# Patient Record
Sex: Female | Born: 1945 | Race: White | Hispanic: No | Smoking: Never smoker
Health system: Southern US, Community
[De-identification: ages and names within clinical notes are randomized; demographics above are authoritative.]

## PROBLEM LIST (undated history)

## (undated) DIAGNOSIS — S76119A Strain of unspecified quadriceps muscle, fascia and tendon, initial encounter: Secondary | ICD-10-CM

## (undated) DIAGNOSIS — M8580 Other specified disorders of bone density and structure, unspecified site: Secondary | ICD-10-CM

## (undated) DIAGNOSIS — Z79899 Other long term (current) drug therapy: Secondary | ICD-10-CM

## (undated) DIAGNOSIS — E079 Disorder of thyroid, unspecified: Secondary | ICD-10-CM

## (undated) DIAGNOSIS — N959 Unspecified menopausal and perimenopausal disorder: Secondary | ICD-10-CM

## (undated) DIAGNOSIS — T7840XA Allergy, unspecified, initial encounter: Secondary | ICD-10-CM

## (undated) DIAGNOSIS — H18519 Endothelial corneal dystrophy, unspecified eye: Secondary | ICD-10-CM

## (undated) DIAGNOSIS — H1851 Endothelial corneal dystrophy: Secondary | ICD-10-CM

## (undated) DIAGNOSIS — S62109B Fracture of unspecified carpal bone, unspecified wrist, initial encounter for open fracture: Secondary | ICD-10-CM

## (undated) DIAGNOSIS — G4700 Insomnia, unspecified: Secondary | ICD-10-CM

## (undated) DIAGNOSIS — Z8619 Personal history of other infectious and parasitic diseases: Secondary | ICD-10-CM

## (undated) DIAGNOSIS — M1990 Unspecified osteoarthritis, unspecified site: Secondary | ICD-10-CM

## (undated) DIAGNOSIS — E039 Hypothyroidism, unspecified: Secondary | ICD-10-CM

## (undated) HISTORY — PX: EYE SURGERY: SHX253

## (undated) HISTORY — PX: TUBAL LIGATION: SHX77

## (undated) HISTORY — PX: TRIGGER FINGER RELEASE: SHX641

## (undated) HISTORY — PX: TOTAL HIP ARTHROPLASTY: SHX124

## (undated) HISTORY — PX: CARPAL TUNNEL RELEASE: SHX101

## (undated) HISTORY — PX: JOINT REPLACEMENT: SHX530

## (undated) HISTORY — PX: CERVICAL FUSION: SHX112

---

## 2001-01-08 ENCOUNTER — Encounter: Payer: Self-pay | Admitting: Neurosurgery

## 2001-01-08 ENCOUNTER — Inpatient Hospital Stay (HOSPITAL_COMMUNITY): Admission: RE | Admit: 2001-01-08 | Discharge: 2001-01-10 | Payer: Self-pay | Admitting: Neurosurgery

## 2001-01-27 ENCOUNTER — Encounter: Admission: RE | Admit: 2001-01-27 | Discharge: 2001-01-27 | Payer: Self-pay | Admitting: Neurosurgery

## 2001-01-27 ENCOUNTER — Encounter: Payer: Self-pay | Admitting: Neurosurgery

## 2001-03-03 ENCOUNTER — Encounter: Payer: Self-pay | Admitting: Neurosurgery

## 2001-03-03 ENCOUNTER — Encounter: Admission: RE | Admit: 2001-03-03 | Discharge: 2001-03-03 | Payer: Self-pay | Admitting: Neurosurgery

## 2001-08-05 ENCOUNTER — Encounter: Admission: RE | Admit: 2001-08-05 | Discharge: 2001-08-05 | Payer: Self-pay | Admitting: Neurosurgery

## 2001-08-05 ENCOUNTER — Encounter: Payer: Self-pay | Admitting: Neurosurgery

## 2002-08-31 HISTORY — PX: DE QUERVAIN'S RELEASE: SHX1439

## 2005-02-11 ENCOUNTER — Ambulatory Visit: Payer: Self-pay

## 2005-08-01 ENCOUNTER — Ambulatory Visit: Payer: Self-pay | Admitting: Podiatry

## 2006-07-16 ENCOUNTER — Ambulatory Visit: Payer: Self-pay

## 2007-07-19 ENCOUNTER — Ambulatory Visit: Payer: Self-pay

## 2008-07-20 ENCOUNTER — Ambulatory Visit: Payer: Self-pay

## 2009-08-07 ENCOUNTER — Ambulatory Visit: Payer: Self-pay

## 2011-06-26 DIAGNOSIS — N959 Unspecified menopausal and perimenopausal disorder: Secondary | ICD-10-CM | POA: Insufficient documentation

## 2011-06-26 DIAGNOSIS — G4700 Insomnia, unspecified: Secondary | ICD-10-CM | POA: Insufficient documentation

## 2011-07-02 DIAGNOSIS — E039 Hypothyroidism, unspecified: Secondary | ICD-10-CM | POA: Insufficient documentation

## 2011-07-02 DIAGNOSIS — Z79899 Other long term (current) drug therapy: Secondary | ICD-10-CM | POA: Insufficient documentation

## 2012-01-18 ENCOUNTER — Ambulatory Visit: Payer: Self-pay | Admitting: Family Medicine

## 2012-02-10 ENCOUNTER — Ambulatory Visit: Payer: Self-pay | Admitting: Ophthalmology

## 2012-03-30 ENCOUNTER — Ambulatory Visit: Payer: Self-pay | Admitting: Ophthalmology

## 2012-06-29 DIAGNOSIS — M8580 Other specified disorders of bone density and structure, unspecified site: Secondary | ICD-10-CM | POA: Insufficient documentation

## 2012-06-29 DIAGNOSIS — H18519 Endothelial corneal dystrophy, unspecified eye: Secondary | ICD-10-CM | POA: Insufficient documentation

## 2012-09-22 ENCOUNTER — Ambulatory Visit: Payer: Self-pay | Admitting: Internal Medicine

## 2013-10-11 ENCOUNTER — Ambulatory Visit: Payer: Self-pay | Admitting: Internal Medicine

## 2013-11-05 ENCOUNTER — Ambulatory Visit: Payer: Self-pay | Admitting: Physician Assistant

## 2014-10-02 ENCOUNTER — Ambulatory Visit: Payer: Self-pay | Admitting: General Practice

## 2014-10-02 DIAGNOSIS — Z01810 Encounter for preprocedural cardiovascular examination: Secondary | ICD-10-CM

## 2014-10-02 LAB — URINALYSIS, COMPLETE
BILIRUBIN, UR: NEGATIVE
Bacteria: NONE SEEN
Blood: NEGATIVE
Glucose,UR: NEGATIVE mg/dL (ref 0–75)
KETONE: NEGATIVE
LEUKOCYTE ESTERASE: NEGATIVE
Nitrite: NEGATIVE
Ph: 8 (ref 4.5–8.0)
Protein: NEGATIVE
Specific Gravity: 1.005 (ref 1.003–1.030)
Squamous Epithelial: NONE SEEN
WBC UR: <1 /HPF (ref 0–5)

## 2014-10-02 LAB — MRSA PCR SCREENING

## 2014-10-02 LAB — BASIC METABOLIC PANEL
Anion Gap: 9 (ref 7–16)
BUN: 15 mg/dL (ref 7–18)
Calcium, Total: 9.2 mg/dL (ref 8.5–10.1)
Chloride: 107 mmol/L (ref 98–107)
Co2: 26 mmol/L (ref 21–32)
Creatinine: 0.89 mg/dL (ref 0.60–1.30)
EGFR (African American): >60
EGFR (Non-African Amer.): >60
Glucose: 80 mg/dL (ref 65–99)
Osmolality: 283 (ref 275–301)
Potassium: 3.9 mmol/L (ref 3.5–5.1)
Sodium: 142 mmol/L (ref 136–145)

## 2014-10-02 LAB — CBC
HCT: 39.5 % (ref 35.0–47.0)
HGB: 13 g/dL (ref 12.0–16.0)
MCH: 31.1 pg (ref 26.0–34.0)
MCHC: 32.9 g/dL (ref 32.0–36.0)
MCV: 95 fL (ref 80–100)
Platelet: 412 10*3/uL (ref 150–440)
RBC: 4.17 10*6/uL (ref 3.80–5.20)
RDW: 14.7 % — ABNORMAL HIGH (ref 11.5–14.5)
WBC: 8 10*3/uL (ref 3.6–11.0)

## 2014-10-02 LAB — APTT: Activated PTT: 27.9 secs (ref 23.6–35.9)

## 2014-10-02 LAB — PROTIME-INR
INR: 1
Prothrombin Time: 12.6 secs (ref 11.5–14.7)

## 2014-10-02 LAB — SEDIMENTATION RATE: Erythrocyte Sed Rate: 24 mm/hr (ref 0–30)

## 2014-10-03 LAB — URINE CULTURE

## 2014-10-11 ENCOUNTER — Inpatient Hospital Stay: Payer: Self-pay | Admitting: General Practice

## 2014-10-12 LAB — BASIC METABOLIC PANEL
Anion Gap: 9 (ref 7–16)
BUN: 11 mg/dL (ref 7–18)
CALCIUM: 7.7 mg/dL — AB (ref 8.5–10.1)
Chloride: 104 mmol/L (ref 98–107)
Co2: 23 mmol/L (ref 21–32)
Creatinine: 0.74 mg/dL (ref 0.60–1.30)
EGFR (Non-African Amer.): >60
Glucose: 118 mg/dL — ABNORMAL HIGH (ref 65–99)
Osmolality: 272 (ref 275–301)
POTASSIUM: 3.9 mmol/L (ref 3.5–5.1)
Sodium: 136 mmol/L (ref 136–145)

## 2014-10-12 LAB — PLATELET COUNT: PLATELETS: 321 10*3/uL (ref 150–440)

## 2014-10-12 LAB — HEMOGLOBIN: HGB: 11 g/dL — ABNORMAL LOW (ref 12.0–16.0)

## 2014-10-13 LAB — BASIC METABOLIC PANEL
ANION GAP: 7 (ref 7–16)
BUN: 11 mg/dL (ref 7–18)
CALCIUM: 8.7 mg/dL (ref 8.5–10.1)
Chloride: 105 mmol/L (ref 98–107)
Co2: 24 mmol/L (ref 21–32)
Creatinine: 0.78 mg/dL (ref 0.60–1.30)
EGFR (African American): >60
GLUCOSE: 126 mg/dL — AB (ref 65–99)
Osmolality: 273 (ref 275–301)
Potassium: 3.3 mmol/L — ABNORMAL LOW (ref 3.5–5.1)
Sodium: 136 mmol/L (ref 136–145)

## 2014-10-13 LAB — HEMOGLOBIN: HGB: 11.4 g/dL — AB (ref 12.0–16.0)

## 2014-10-13 LAB — PLATELET COUNT: Platelet: 362 10*3/uL (ref 150–440)

## 2014-11-15 ENCOUNTER — Ambulatory Visit: Payer: Self-pay | Admitting: Internal Medicine

## 2015-02-01 DIAGNOSIS — S76119A Strain of unspecified quadriceps muscle, fascia and tendon, initial encounter: Secondary | ICD-10-CM | POA: Insufficient documentation

## 2015-03-24 NOTE — Discharge Summary (Signed)
PATIENT NAME:  Nicole Jacobson, Nicole Jacobson MR#:  [id number] DATE OF BIRTH:  June 16, 1946  DATE OF ADMISSION:  10/11/2014 DATE OF DISCHARGE:  10/14/2014  ADMITTING DIAGNOSIS: Degenerative arthrosis of the left hip.   DISCHARGE DIAGNOSIS: Degenerative arthrosis of the left hip.   HISTORY: The patient is a 69 year old female who has been following at [facility] for progression of left hip and groin pain. She reported a long history of progressive left hip and groin pain. Her pain was noted to be aggravated with weight-bearing activities. She had also appreciated some decrease in her left hip range of motion. On occasion, the patient was using a cane for ambulation due to the severity of pain. She had not seen any significant improvement in her condition despite the use of meloxicam. The pain had progressed to the point that it was significantly interfering with her activities of daily living. X-rays taken in [facility] orthopedics department showed significant narrowing of the cartilage space. There was subchondral sclerosis as well as osteophyte formation noted. After discussion of the risks and benefits of surgical intervention, the patient expressed her understanding of the risks and benefits and agreed for plans for surgical intervention.   HOSPITAL COURSE PROCEDURE: Left total hip arthroplasty.   ANESTHESIA: Spinal.   IMPLANTS UTILIZED: DePuy 13.5 mm small stature AML femoral stem, a 48 mm outer diameter Pinnacle 100 acetabular component, +4 mm neutral Pinnacle Marathon polyethylene liner, and a 32 mm cobalt chrome hip ball with a +1 mm neck length.   HOSPITAL COURSE: The patient tolerated the procedure very well. She had no complications. She was then taken to the PACU where she was stabilized and then transferred to the orthopedic floor. The patient began receiving anticoagulation therapy of Lovenox 30 mg subcutaneous every 12 hours per anesthesia and pharmacy protocol. She was fitted with TED  stockings bilaterally. These were allowed to be removed 1 hour per 8 hour shift. She was also fitted with AVI compression foot pumps bilaterally set at 80 mmHg. Her calves have been nontender. There has been no evidence of any DVTs. Heels were elevated off the bed using rolled towels.   The patient has denied any chest pain or shortness of breath. Her vital signs have been stable. She has been afebrile. Hemodynamically she was stable. No transfusions were given.   Physical therapy was initiated on day 1 for gait training and transfers. She has done extremely well. She was ambulating greater than 200 feet upon discharge. She was able to go up and down 4 sets of steps. She was independent with bed to chair transfers. Occupational therapy was also initiated on day 1 for ADLs and assistive devices.   The patient's IV, Foley, and Hemovac were discontinued on day 2, along with a dressing change. The wound has been drying for any signs of infection.   DISPOSITION: The patient is being discharged to home in improved stable condition.   DISCHARGE INSTRUCTIONS: She may continue to weight bear as tolerated. Continue using a walker until cleared by physical therapy to go to a quad cane. She will receive home health PT. Continue with the TED stockings. These are to be worn during the day but may be removed at night. She is gone over the hip precautions once again. Elevate the heels off the bed. Recommend that she continue with incentive spirometer q. 1 hour while awake and encouraged the patient to do cough and deep breathing q. 2 hours while awake. She is placed on a regular  diet.   She was instructed on wound care. Physical therapist will remove the staples in 2 weeks on 11/25. They are to apply benzoin and Steri-Strips. Call the clinic if any signs of infection. If she has any fevers of 101.5, she is to call the clinic. She has a followup appointment on Novemeber 24 at 9:15.   The patient is discharged to home  on Lovenox 40 mg subcutaneous daily for 14 days, then discontinue and begin taking one 81 mg enteric-coated aspirin, oxycodone 5 to 10 mg every 4 to 6 hours p.r.n. for pain, Ultram 50 to 100 mg every 4 to 6 hours p.r.n. for pain. She may resume her regular medication that she was on prior to admission.   PAST MEDICAL HISTORY: Thyroid disease, depression.    ____________________________ Van ClinesJon Wolfe, PA jrw:at D: 10/14/2014 07:27:30 ET T: 10/14/2014 12:41:47 ET JOB#: [id number]  cc: Van ClinesJon Wolfe, PA, <Dictator> JON WOLFE PA ELECTRONICALLY SIGNED 10/24/2014 21:05

## 2015-03-24 NOTE — Op Note (Signed)
PATIENT NAME:  Nicole Jacobson, Nicole Jacobson MR#:  [id number] DATE OF BIRTH:  02/10/1946  DATE OF PROCEDURE:  10/11/2014  PREOPERATIVE DIAGNOSIS: Degenerative arthrosis of the left hip.   POSTOPERATIVE DIAGNOSIS: Degenerative arthrosis of the left hip.   PROCEDURE PERFORMED: Left total hip arthroplasty.   SURGEON: Francesco Sor, MD.   ASSISTANT:  Van Clines, PA (required to maintain retraction throughout the procedure).   ANESTHESIA: Spinal.   ESTIMATED BLOOD LOSS: 100 mL.   FLUIDS REPLACED: 2100 mL of crystalloid.   DRAINS: Two medium drains to Hemovac reservoir.   IMPLANTS UTILIZED: DePuy 13.5-mm small stature AML femoral stem, a 48-mm outer diameter Pinnacle 100 acetabular component, +4-mm neutral Pinnacle Marathon polyethylene liner, and a 32-mm cobalt chrome hip ball with a +1-mm neck length.   INDICATIONS FOR SURGERY: The patient is a 69 year old female who has been seen for complaints of progressive left hip and groin pain. Radiographs demonstrated significant degenerative changes. After discussion of the risks and benefits of surgical intervention, the patient expressed understanding of the risks and benefits, and agreed with plans for surgical intervention.   PROCEDURE IN DETAIL: The patient was brought to the operating room and after adequate spinal anesthesia was achieved the patient was placed in a right lateral decubitus position. Axillary roll was placed and all bony prominences were well padded. The patient's left hip and leg were cleaned and prepped with alcohol and DuraPrep and draped in the usual sterile fashion. A "timeout" was performed as per usual protocol. A lateral curvilinear incision was made gently curving towards the posterior superior iliac spine. IT band was incised in line with the skin incision and the fibers of the gluteus maximus were split in line. The piriformis tendon was identified, skeletonized and incised at its insertion and the proximal femur and reflected  posteriorly. In a similar fashion the short external rotators were incised and reflected posteriorly. A T-type posterior capsulotomy was performed. Prior to dislocation of the femoral head a threaded Steinmann pin was inserted through a separate stab incision into the pelvis superior to the acetabulum and bent in the form of a stylus so as to assess limb length and hip offset throughout the procedure.   The femoral head was then dislocated posteriorly. Inspection of the head demonstrated severe degenerative changes with full-thickness loss of articular cartilage. The femoral neck cut was performed using an oscillating saw. The capsule was elevated off of the anterior portion of the femoral neck. Inspection of the acetabulum also demonstrated degenerative changes. The remnant of the labrum was excised. The acetabulum was reamed in a sequential fashion up to a 47-mm diameter. Good punctate bleeding bone was encountered. A 48-mm Pinnacle 100 acetabular component was positioned and impacted into place. Excellent scratch fit was appreciated. Anterior osteophyte was debrided using osteotome and rongeur. A +4 mm neutral polyethylene trial was inserted and attention was directed to the proximal femur.   A pilot hole for reaming of the proximal femoral canal was created using a high-speed bur. The proximal femoral canal was reamed in a sequential fashion up to a 13-mm diameter. This allowed for approximately 6 cm of scratch fit. The proximal femur was prepared using a 13.5-mm aggressive side-biting reamer. Serial broaches were inserted up to a 13.5-mm small stature broach. The calcar region was planed and trial reduction was performed with a 32-mm hip ball with a +1-mm neck length. This allowed for good equalization of limb lengths and appropriate hip offset. Excellent stability was noted both anteriorly and posteriorly.  Trial components were removed. The acetabular shell was irrigated with normal saline with  antibiotic solution and suctioned dry. A +40-mm neutral Pinnacle Marathon polyethylene liner was positioned and impacted into place. Next, a 13.5-mm small stature AML femoral component was positioned and impacted into place. Excellent scratch fit was appreciated. The Morse taper was cleaned and dried. A 32-mm cobalt chrome hip ball with a +1-mm neck length was placed on the trunnion and impacted into place. The hip was reduced and placed through a range of motion. Excellent stability was appreciated both anteriorly and posteriorly. Good equalization of limb lengths was appreciated and appropriate hip offset was noted.   The wound was irrigated with copious amounts of normal saline with antibiotic solution and then suctioned dry. Good hemostasis was appreciated. The posterior capsulotomy was repaired using #5 Ethibond. The piriformis tendon was reapproximated on the surface of the gluteus medius tendon using #5 Ethibond. Two medium drains were placed in the wound bed and brought out through a separate stab incision to be attached to a Hemovac reservoir. IT band was repaired using interrupted sutures of #1 Vicryl. The subcutaneous tissue was approximated in layers using first 0-Vicryl followed by 2-0 Vicryl. Skin was closed with skin staples. A sterile dressing was applied. The patient tolerated the procedure well. She was transported to the recovery room in stable condition.    ____________________________ Illene LabradorJames P. Angie FavaHooten Jr., MD jph:lt D: 10/12/2014 06:27:40 ET T: 10/12/2014 07:58:22 ET JOB#: [id number]  cc: Illene LabradorJames P. Angie FavaHooten Jr., MD, <Dictator> JAMES P Angie FavaHOOTEN JR MD ELECTRONICALLY SIGNED 10/19/2014 21:40

## 2015-03-25 NOTE — Op Note (Signed)
PATIENT NAME:  Nicole Jacobson, Nicole Jacobson MR#:  [id number] DATE OF BIRTH:  March 21, 1946  DATE OF PROCEDURE:  03/30/2012  PREOPERATIVE DIAGNOSIS: Visually significant cataract of the right eye.   POSTOPERATIVE DIAGNOSIS: Visually significant cataract of the right eye.   OPERATIVE PROCEDURE: Cataract extraction by phacoemulsification with implant of intraocular lens to right eye.   SURGEON: Galen ManilaWilliam Meriel Kelliher, MD.   ANESTHESIA:  1. Managed anesthesia care.  2. Topical tetracaine drops followed by 2% Xylocaine jelly applied in the preoperative holding area.   COMPLICATIONS: None.   TECHNIQUE:  Four quadrant divide and conquer.   DESCRIPTION OF PROCEDURE: The patient was examined and consented in the preoperative holding area where the aforementioned topical anesthesia was applied to the right eye and then brought back to the Operating Room where the right eye was prepped and draped in the usual sterile ophthalmic fashion and a lid speculum was placed. A paracentesis was created with the side port blade and the anterior chamber was filled with viscoelastic. A near clear corneal incision was performed with the steel keratome. A continuous curvilinear capsulorrhexis was performed with a cystotome followed by the capsulorrhexis forceps. Hydrodissection and hydrodelineation were carried out with BSS on a blunt cannula. The lens was removed in a four quadrant divide and conquer technique and the remaining cortical material was removed with the irrigation-aspiration handpiece. The capsular bag was inflated with viscoelastic and the Tecnis ZCB00 22.0-diopter lens, serial number [id number] was placed in the capsular bag without complication. The remaining viscoelastic was removed from the eye with the irrigation-aspiration handpiece. The wounds were hydrated. The anterior chamber was flushed with Miostat and the eye was inflated to physiologic pressure. The wounds were found to be water tight. The eye was dressed with  Vigamox. The patient was given protective glasses to wear throughout the day and a shield with which to sleep tonight. The patient was also given drops with which to begin a drop regimen today and will follow-up with me in one day.   ____________________________ Jerilee FieldWilliam L. Nollan Muldrow, MD wlp:slb D: 03/30/2012 12:22:45 ET Jacobson: 03/30/2012 12:44:23 ET JOB#: [id number]  cc: Taleen Prosser L. Khalel Alms, MD, <Dictator> Jerilee FieldWILLIAM L Naryah Clenney MD ELECTRONICALLY SIGNED 04/06/2012 13:34

## 2015-03-25 NOTE — Op Note (Signed)
PATIENT NAME:  Nicole Jacobson, Nicole Jacobson DATE OF BIRTH:  01-13-1946  DATE OF PROCEDURE:  02/10/2012  PREOPERATIVE DIAGNOSIS: Visually significant cataract of the left eye.   POSTOPERATIVE DIAGNOSIS: Visually significant cataract of the left eye.   OPERATIVE PROCEDURE: Cataract extraction by phacoemulsification with implant of intraocular lens to left eye.   SURGEON: Galen ManilaWilliam Creedon Danielski, MD.   ANESTHESIA:  1. Managed anesthesia care.  2. Topical tetracaine drops followed by 2% Xylocaine jelly applied in the preoperative holding area.   COMPLICATIONS: None.   TECHNIQUE:  Stop-and-chop    DESCRIPTION OF PROCEDURE: The patient was examined and consented in the preoperative holding area where the aforementioned topical anesthesia was applied to the left eye and then brought back to the Operating Room where the left eye was prepped and draped in the usual sterile ophthalmic fashion and a lid speculum was placed. A paracentesis was created with the side port blade and the anterior chamber was filled with viscoelastic. A near clear corneal incision was performed with the steel keratome. A continuous curvilinear capsulorrhexis was performed with a cystotome followed by the capsulorrhexis forceps. Hydrodissection and hydrodelineation were carried out with BSS on a blunt cannula. The lens was removed in a stop-and-chop technique and the remaining cortical material was removed with the irrigation-aspiration handpiece. The capsular bag was inflated with viscoelastic and the Technus ZCB00 22.5-diopter lens, serial number [id number] was placed in the capsular bag without complication. The remaining viscoelastic was removed from the eye with the irrigation-aspiration handpiece. The wounds were hydrated. The anterior chamber was flushed with Miostat and the eye was inflated to physiologic pressure. The wounds were found to be water tight. The eye was dressed with Vigamox. The patient was given protective  glasses to wear throughout the day and a shield with which to sleep tonight. The patient was also given drops with which to begin a drop regimen today and will follow-up with me in one day.   ____________________________ Jerilee FieldWilliam L. Sadee Osland, MD wlp:drc D: 02/10/2012 13:10:02 ET Jacobson: 02/10/2012 13:21:50 ET JOB#: [id number]  cc: Frederico Gerling L. Zerrick Hanssen, MD, <Dictator> Jerilee FieldWILLIAM L Marjarie Irion MD ELECTRONICALLY SIGNED 02/11/2012 13:42

## 2015-03-26 LAB — SURGICAL PATHOLOGY

## 2015-10-08 DIAGNOSIS — Z96643 Presence of artificial hip joint, bilateral: Secondary | ICD-10-CM | POA: Insufficient documentation

## 2015-12-05 ENCOUNTER — Encounter: Payer: Self-pay | Admitting: *Deleted

## 2015-12-06 ENCOUNTER — Ambulatory Visit: Payer: Medicare Other | Admitting: Anesthesiology

## 2015-12-06 ENCOUNTER — Encounter: Payer: Self-pay | Admitting: *Deleted

## 2015-12-06 ENCOUNTER — Ambulatory Visit
Admission: RE | Admit: 2015-12-06 | Discharge: 2015-12-06 | Disposition: A | Discharge status: Home / Self Care | Payer: Medicare Other | Source: Ambulatory Visit | Attending: Unknown Physician Specialty | Admitting: Unknown Physician Specialty

## 2015-12-06 ENCOUNTER — Encounter
Admission: RE | Disposition: A | Discharge status: Home / Self Care | Payer: Self-pay | Source: Ambulatory Visit | Attending: Unknown Physician Specialty

## 2015-12-06 DIAGNOSIS — Z86010 Personal history of colonic polyps: Secondary | ICD-10-CM | POA: Insufficient documentation

## 2015-12-06 DIAGNOSIS — Z9889 Other specified postprocedural states: Secondary | ICD-10-CM | POA: Insufficient documentation

## 2015-12-06 DIAGNOSIS — K5730 Diverticulosis of large intestine without perforation or abscess without bleeding: Secondary | ICD-10-CM | POA: Insufficient documentation

## 2015-12-06 DIAGNOSIS — M8580 Other specified disorders of bone density and structure, unspecified site: Secondary | ICD-10-CM | POA: Diagnosis not present

## 2015-12-06 DIAGNOSIS — M1990 Unspecified osteoarthritis, unspecified site: Secondary | ICD-10-CM | POA: Diagnosis not present

## 2015-12-06 DIAGNOSIS — K640 First degree hemorrhoids: Secondary | ICD-10-CM | POA: Insufficient documentation

## 2015-12-06 DIAGNOSIS — E039 Hypothyroidism, unspecified: Secondary | ICD-10-CM | POA: Insufficient documentation

## 2015-12-06 DIAGNOSIS — Z9109 Other allergy status, other than to drugs and biological substances: Secondary | ICD-10-CM | POA: Insufficient documentation

## 2015-12-06 DIAGNOSIS — Z9842 Cataract extraction status, left eye: Secondary | ICD-10-CM | POA: Diagnosis not present

## 2015-12-06 DIAGNOSIS — Z96643 Presence of artificial hip joint, bilateral: Secondary | ICD-10-CM | POA: Insufficient documentation

## 2015-12-06 DIAGNOSIS — Z981 Arthrodesis status: Secondary | ICD-10-CM | POA: Insufficient documentation

## 2015-12-06 DIAGNOSIS — Z7951 Long term (current) use of inhaled steroids: Secondary | ICD-10-CM | POA: Diagnosis not present

## 2015-12-06 DIAGNOSIS — Z7982 Long term (current) use of aspirin: Secondary | ICD-10-CM | POA: Insufficient documentation

## 2015-12-06 DIAGNOSIS — Z9841 Cataract extraction status, right eye: Secondary | ICD-10-CM | POA: Diagnosis not present

## 2015-12-06 DIAGNOSIS — Z79899 Other long term (current) drug therapy: Secondary | ICD-10-CM | POA: Insufficient documentation

## 2015-12-06 HISTORY — DX: Fracture of unspecified carpal bone, unspecified wrist, initial encounter for open fracture: S62.109B

## 2015-12-06 HISTORY — DX: Hypothyroidism, unspecified: E03.9

## 2015-12-06 HISTORY — DX: Allergy, unspecified, initial encounter: T78.40XA

## 2015-12-06 HISTORY — PX: COLONOSCOPY WITH PROPOFOL: SHX5780

## 2015-12-06 HISTORY — DX: Unspecified menopausal and perimenopausal disorder: N95.9

## 2015-12-06 HISTORY — DX: Insomnia, unspecified: G47.00

## 2015-12-06 HISTORY — DX: Disorder of thyroid, unspecified: E07.9

## 2015-12-06 HISTORY — DX: Unspecified osteoarthritis, unspecified site: M19.90

## 2015-12-06 HISTORY — DX: Personal history of other infectious and parasitic diseases: Z86.19

## 2015-12-06 HISTORY — DX: Strain of unspecified quadriceps muscle, fascia and tendon, initial encounter: S76.119A

## 2015-12-06 HISTORY — DX: Endothelial corneal dystrophy, unspecified eye: H18.519

## 2015-12-06 HISTORY — DX: Other long term (current) drug therapy: Z79.899

## 2015-12-06 HISTORY — DX: Other specified disorders of bone density and structure, unspecified site: M85.80

## 2015-12-06 HISTORY — DX: Endothelial corneal dystrophy: H18.51

## 2015-12-06 SURGERY — COLONOSCOPY WITH PROPOFOL
Anesthesia: General

## 2015-12-06 MED ORDER — PROPOFOL 500 MG/50ML IV EMUL
INTRAVENOUS | Status: DC | PRN
  Administered 2015-12-06: 120 ug/kg/min via INTRAVENOUS

## 2015-12-06 MED ORDER — SODIUM CHLORIDE 0.9 % IV SOLN
INTRAVENOUS | Status: DC
  Administered 2015-12-06: 1000 mL via INTRAVENOUS

## 2015-12-06 MED ORDER — MIDAZOLAM HCL 2 MG/2ML IJ SOLN
INTRAMUSCULAR | Status: DC | PRN
  Administered 2015-12-06: 1 mg via INTRAVENOUS

## 2015-12-06 MED ORDER — FENTANYL CITRATE (PF) 100 MCG/2ML IJ SOLN
INTRAMUSCULAR | Status: DC | PRN
Start: 2015-12-06 — End: 2015-12-06
  Administered 2015-12-06: 50 ug via INTRAVENOUS

## 2015-12-06 MED ORDER — PIPERACILLIN-TAZOBACTAM 3.375 G IVPB 30 MIN
3.3750 g | Freq: Once | INTRAVENOUS | Status: AC
  Administered 2015-12-06: 3.375 g via INTRAVENOUS
  Filled 2015-12-06: qty 50

## 2015-12-06 MED ORDER — EPHEDRINE SULFATE 50 MG/ML IJ SOLN
INTRAMUSCULAR | Status: DC | PRN
  Administered 2015-12-06: 10 mg via INTRAVENOUS
  Administered 2015-12-06: 5 mg via INTRAVENOUS

## 2015-12-06 NOTE — Op Note (Signed)
[facility] Gastroenterology Patient Name: Nicole Jacobson Procedure Date: 12/06/2015 9:14 AM MRN: [id number] Account #: [id number] Date of Birth: 08-Mar-1946 Admit Type: Outpatient Age: 70 Room: [facility] ENDO ROOM 1 Gender: Female Note Status: Finalized Procedure:         Colonoscopy Indications:       High risk colon cancer surveillance: Personal history of                     colonic polyps Providers:         Scot Jun, MD Referring MD:      Cecille Aver (Referring MD) Medicines:         Propofol per Anesthesia Complications:     No immediate complications. Procedure:         Pre-Anesthesia Assessment:                    - After reviewing the risks and benefits, the patient was                     deemed in satisfactory condition to undergo the procedure.                    - After reviewing the risks and benefits, the patient was                     deemed in satisfactory condition to undergo the procedure.                    After obtaining informed consent, the colonoscope was                     passed under direct vision. Throughout the procedure, the                     patient's blood pressure, pulse, and oxygen saturations                     were monitored continuously. The Colonoscope was                     introduced through the anus and advanced to the the cecum,                     identified by appendiceal orifice and ileocecal valve. The                     colonoscopy was performed without difficulty. The patient                     tolerated the procedure well. The quality of the bowel                     preparation was good. Findings:      Many small-mouthed diverticula were found in the sigmoid colon, in the       descending colon and in the ascending colon.      Internal hemorrhoids were found during endoscopy. The hemorrhoids were       small -medium and Grade I (internal hemorrhoids that do not prolapse).      The exam was  otherwise without abnormality. Impression:        - Diverticulosis in the sigmoid colon, in the descending  colon and in the ascending colon.                    - Internal hemorrhoids.                    - The examination was otherwise normal.                    - No specimens collected. Recommendation:    - Repeat colonoscopy in 5 years for surveillance. Scot Junobert T Elliott, MD 12/06/2015 9:49:42 AM This report has been signed electronically. Number of Addenda: 0 Note Initiated On: 12/06/2015 9:14 AM Scope Withdrawal Time: 0 hours 6 minutes 21 seconds  Total Procedure Duration: 0 hours 14 minutes 39 seconds       [facility]

## 2015-12-06 NOTE — Transfer of Care (Signed)
Immediate Anesthesia Transfer of Care Note  Patient: Nicole Jacobson  Procedure(s) Performed: Procedure(s): COLONOSCOPY WITH PROPOFOL (N/A)  Patient Location: PACU  Anesthesia Type:General  Level of Consciousness: awake, alert , oriented and sedated  Airway & Oxygen Therapy: Patient Spontanous Breathing and Patient connected to nasal cannula oxygen  Post-op Assessment: Report given to RN and Post -op Vital signs reviewed and stable  Post vital signs: Reviewed and stable  Last Vitals:  Filed Vitals:   12/06/15 0831  BP: 140/79  Pulse: 64  Temp: 37.6 C  Resp: 16    Complications: No apparent anesthesia complications

## 2015-12-06 NOTE — Anesthesia Preprocedure Evaluation (Signed)
Anesthesia Evaluation  Patient identified by MRN, date of birth, ID band Patient awake    Reviewed: Allergy & Precautions, H&P , NPO status , Patient's Chart, lab work & pertinent test results, reviewed documented beta blocker date and time   History of Anesthesia Complications (+) PONV and history of anesthetic complications  Airway Mallampati: I  TM Distance: >3 FB Neck ROM: full    Dental no notable dental hx. (+) Caps, Teeth Intact   Pulmonary neg pulmonary ROS,    Pulmonary exam normal breath sounds clear to auscultation       Cardiovascular Exercise Tolerance: Good negative cardio ROS Normal cardiovascular exam Rhythm:regular Rate:Normal     Neuro/Psych negative neurological ROS  negative psych ROS   GI/Hepatic negative GI ROS, Neg liver ROS,   Endo/Other  neg diabetesHypothyroidism   Renal/GU negative Renal ROS  negative genitourinary   Musculoskeletal   Abdominal   Peds  Hematology negative hematology ROS (+)   Anesthesia Other Findings Past Medical History:   Insomnia, unspecified                                        Menopausal and postmenopausal disorder                         Comment:unspecified   Hypothyroidism                                                 Comment:unspecified   Encounter for long-term (current) use of other*              Fuch endothelial dystrophy                                   Osteopenia                                                   Quadriceps muscle strain                                     Allergic state                                               Arthritis                                                    Fuchs' corneal dystrophy                                     History of chickenpox  Osteopenia                                                     Comment:hips   Thyroid disease                                               Wrist fracture, open                                           Comment:left wrist   Reproductive/Obstetrics negative OB ROS                             Anesthesia Physical Anesthesia Plan  ASA: II  Anesthesia Plan: General   Post-op Pain Management:    Induction:   Airway Management Planned:   Additional Equipment:   Intra-op Plan:   Post-operative Plan:   Informed Consent: I have reviewed the patients History and Physical, chart, labs and discussed the procedure including the risks, benefits and alternatives for the proposed anesthesia with the patient or authorized representative who has indicated his/her understanding and acceptance.   Dental Advisory Given  Plan Discussed with: Anesthesiologist, CRNA and Surgeon  Anesthesia Plan Comments:         Anesthesia Quick Evaluation

## 2015-12-06 NOTE — Anesthesia Procedure Notes (Signed)
Performed by: COOK-MARTIN, Ahijah Devery Pre-anesthesia Checklist: Patient identified, Emergency Drugs available, Suction available, Patient being monitored and Timeout performed Patient Re-evaluated:Patient Re-evaluated prior to inductionOxygen Delivery Method: Nasal cannula Preoxygenation: Pre-oxygenation with 100% oxygen Intubation Type: IV induction Placement Confirmation: positive ETCO2 and CO2 detector       

## 2015-12-06 NOTE — H&P (Signed)
Primary Care Physician:  Margorie JohnAMSEY,COLLEEN, MD Primary Gastroenterologist:  Dr. Mechele CollinElliott  Pre-Procedure History & Physical: HPI:  Nicole Jacobson is a 70 y.o. female is here for an colonoscopy.   Past Medical History  Diagnosis Date  . Insomnia, unspecified   . Menopausal and postmenopausal disorder     unspecified  . Hypothyroidism     unspecified  . Encounter for long-term (current) use of other high-risk medications   . Fuch endothelial dystrophy   . Osteopenia   . Quadriceps muscle strain   . Allergic state   . Arthritis   . Fuchs' corneal dystrophy   . History of chickenpox   . Osteopenia     hips  . Thyroid disease   . Wrist fracture, open     left wrist    Past Surgical History  Procedure Laterality Date  . Joint replacement      bilateral hips; 10/2014 most recent  . Eye surgery Bilateral     cataract extraction with lens implants  . Tubal ligation    . Carpal tunnel release Right     endoscopic  . Cervical fusion      two level  . De quervain's release Left 08/2002  . Trigger finger release    . Total hip arthroplasty Left     10/11/14  . Total hip arthroplasty Right     07/02/2004    Prior to Admission medications   Medication Sig Start Date End Date Taking? Authorizing Provider  aspirin EC 81 MG tablet Take 81 mg by mouth daily.   Yes Historical Provider, MD  BIOTIN PO Take by mouth.   Yes Historical Provider, MD  Calcium-Vitamin D (CALTRATE 600 PLUS-VIT D PO) Take 1 tablet by mouth 3 (three) times daily.   Yes Historical Provider, MD  fluticasone (FLONASE) 50 MCG/ACT nasal spray Place 1 spray into both nostrils 2 (two) times daily.   Yes Historical Provider, MD  levothyroxine (SYNTHROID, LEVOTHROID) 50 MCG tablet Take 50 mcg by mouth daily before breakfast.   Yes Historical Provider, MD  Multiple Vitamins-Minerals (MULTIVITAMIN & MINERAL PO) Take by mouth.   Yes Historical Provider, MD  Omega-3 Fatty Acids (FISH OIL PO) Take by mouth.   Yes  Historical Provider, MD  zolpidem (AMBIEN) 5 MG tablet Take 10 mg by mouth at bedtime as needed for sleep.   Yes Historical Provider, MD    Allergies as of 11/02/2015  . (Not on File)    History reviewed. No pertinent family history.  Social History   Social History  . Marital Status: Divorced    Spouse Name: N/A  . Number of Children: N/A  . Years of Education: N/A   Occupational History  . Not on file.   Social History Main Topics  . Smoking status: Never Smoker   . Smokeless tobacco: Not on file  . Alcohol Use: 1.8 oz/week    3 Glasses of wine per week  . Drug Use: No  . Sexual Activity: Not on file   Other Topics Concern  . Not on file   Social History Narrative    Review of Systems: See HPI, otherwise negative ROS  Physical Exam: BP 140/79 mmHg  Pulse 64  Temp(Src) 99.7 F (37.6 C) (Tympanic)  Resp 16  Ht 5\' 3"  (1.6 m)  Wt 64.864 kg (143 lb)  BMI 25.34 kg/m2  SpO2 100% General:   Alert,  pleasant and cooperative in NAD Head:  Normocephalic and atraumatic. Neck:  Supple; no  masses or thyromegaly. Lungs:  Clear throughout to auscultation.    Heart:  Regular rate and rhythm. Abdomen:  Soft, nontender and nondistended. Normal bowel sounds, without guarding, and without rebound.   Neurologic:  Alert and  oriented x4;  grossly normal neurologically.  Impression/Plan: Nicole Jacobson is here for an colonoscopy to be performed for Ph colon polyps, Zosyn given for artificial joints bilat hip replacement  Risks, benefits, limitations, and alternatives regarding  colonoscopy have been reviewed with the patient.  Questions have been answered.  All parties agreeable.   Lynnae Prude, MD  12/06/2015, 9:19 AM

## 2015-12-06 NOTE — Anesthesia Postprocedure Evaluation (Signed)
Anesthesia Post Note  Patient: Nicole Jacobson  Procedure(s) Performed: Procedure(s) (LRB): COLONOSCOPY WITH PROPOFOL (N/A)  Patient location during evaluation: Endoscopy Anesthesia Type: General Level of consciousness: awake and alert Pain management: pain level controlled Vital Signs Assessment: post-procedure vital signs reviewed and stable Respiratory status: spontaneous breathing, nonlabored ventilation, respiratory function stable and patient connected to nasal cannula oxygen Cardiovascular status: blood pressure returned to baseline and stable Postop Assessment: no signs of nausea or vomiting Anesthetic complications: no    Last Vitals:  Filed Vitals:   12/06/15 0950 12/06/15 1010  BP: 80/38 110/46  Pulse: 75   Temp: 36.4 C   Resp: 18     Last Pain:  Filed Vitals:   12/06/15 1024  PainSc: 0-No pain                 Lenard SimmerAndrew Melodie Ashworth

## 2016-09-23 ENCOUNTER — Other Ambulatory Visit: Payer: Self-pay | Admitting: Internal Medicine

## 2016-09-23 DIAGNOSIS — E2839 Other primary ovarian failure: Secondary | ICD-10-CM

## 2016-09-23 DIAGNOSIS — Z1231 Encounter for screening mammogram for malignant neoplasm of breast: Secondary | ICD-10-CM

## 2016-10-01 ENCOUNTER — Ambulatory Visit
Admission: RE | Admit: 2016-10-01 | Discharge: 2016-10-01 | Disposition: A | Discharge status: Home / Self Care | Payer: Medicare Other | Source: Ambulatory Visit | Attending: Internal Medicine | Admitting: Internal Medicine

## 2016-10-01 DIAGNOSIS — Z1231 Encounter for screening mammogram for malignant neoplasm of breast: Secondary | ICD-10-CM | POA: Insufficient documentation

## 2016-11-19 ENCOUNTER — Encounter (INDEPENDENT_AMBULATORY_CARE_PROVIDER_SITE_OTHER): Payer: Self-pay

## 2016-11-19 ENCOUNTER — Ambulatory Visit
Admission: RE | Admit: 2016-11-19 | Discharge: 2016-11-19 | Disposition: A | Discharge status: Home / Self Care | Payer: Medicare Other | Source: Ambulatory Visit | Attending: Internal Medicine | Admitting: Internal Medicine

## 2016-11-19 DIAGNOSIS — M810 Age-related osteoporosis without current pathological fracture: Secondary | ICD-10-CM | POA: Insufficient documentation

## 2016-11-19 DIAGNOSIS — E2839 Other primary ovarian failure: Secondary | ICD-10-CM

## 2017-10-08 ENCOUNTER — Other Ambulatory Visit: Payer: Self-pay | Admitting: Internal Medicine

## 2017-10-08 DIAGNOSIS — Z1231 Encounter for screening mammogram for malignant neoplasm of breast: Secondary | ICD-10-CM

## 2017-10-29 ENCOUNTER — Ambulatory Visit
Admission: RE | Admit: 2017-10-29 | Discharge: 2017-10-29 | Disposition: A | Discharge status: Home / Self Care | Payer: Medicare Other | Source: Ambulatory Visit | Attending: Internal Medicine | Admitting: Internal Medicine

## 2017-10-29 DIAGNOSIS — Z1231 Encounter for screening mammogram for malignant neoplasm of breast: Secondary | ICD-10-CM | POA: Insufficient documentation

## 2018-10-21 IMAGING — MG MM DIGITAL SCREENING BILAT W/ CAD
4 series · 4 of 4 positions shown · non-contrast
Comparison: Previous exam(s).

CLINICAL DATA: Screening.

EXAM:
DIGITAL SCREENING BILATERAL MAMMOGRAM WITH CAD

[L MLO]
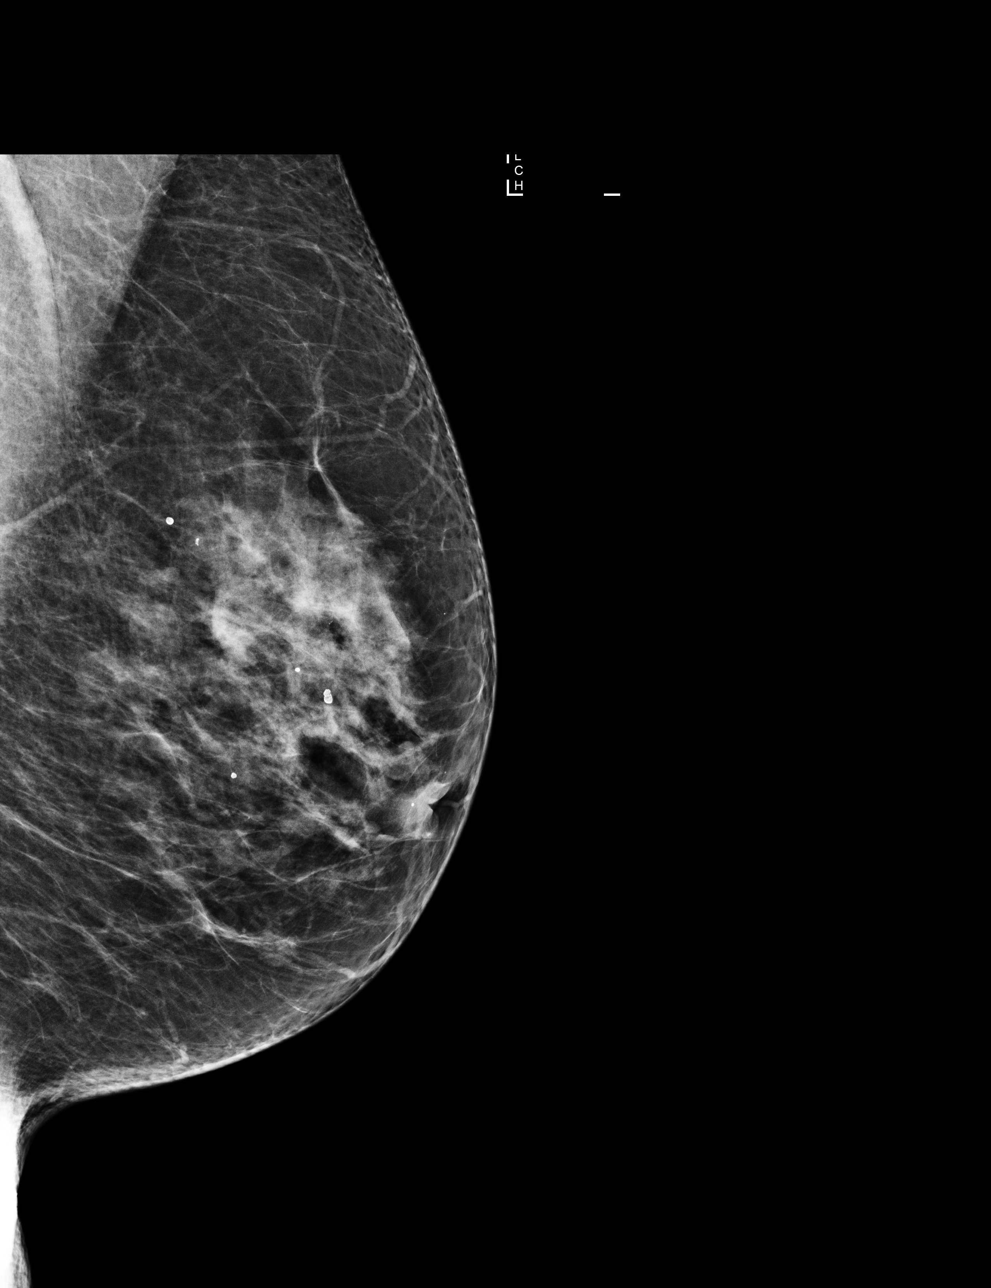

[R CC]
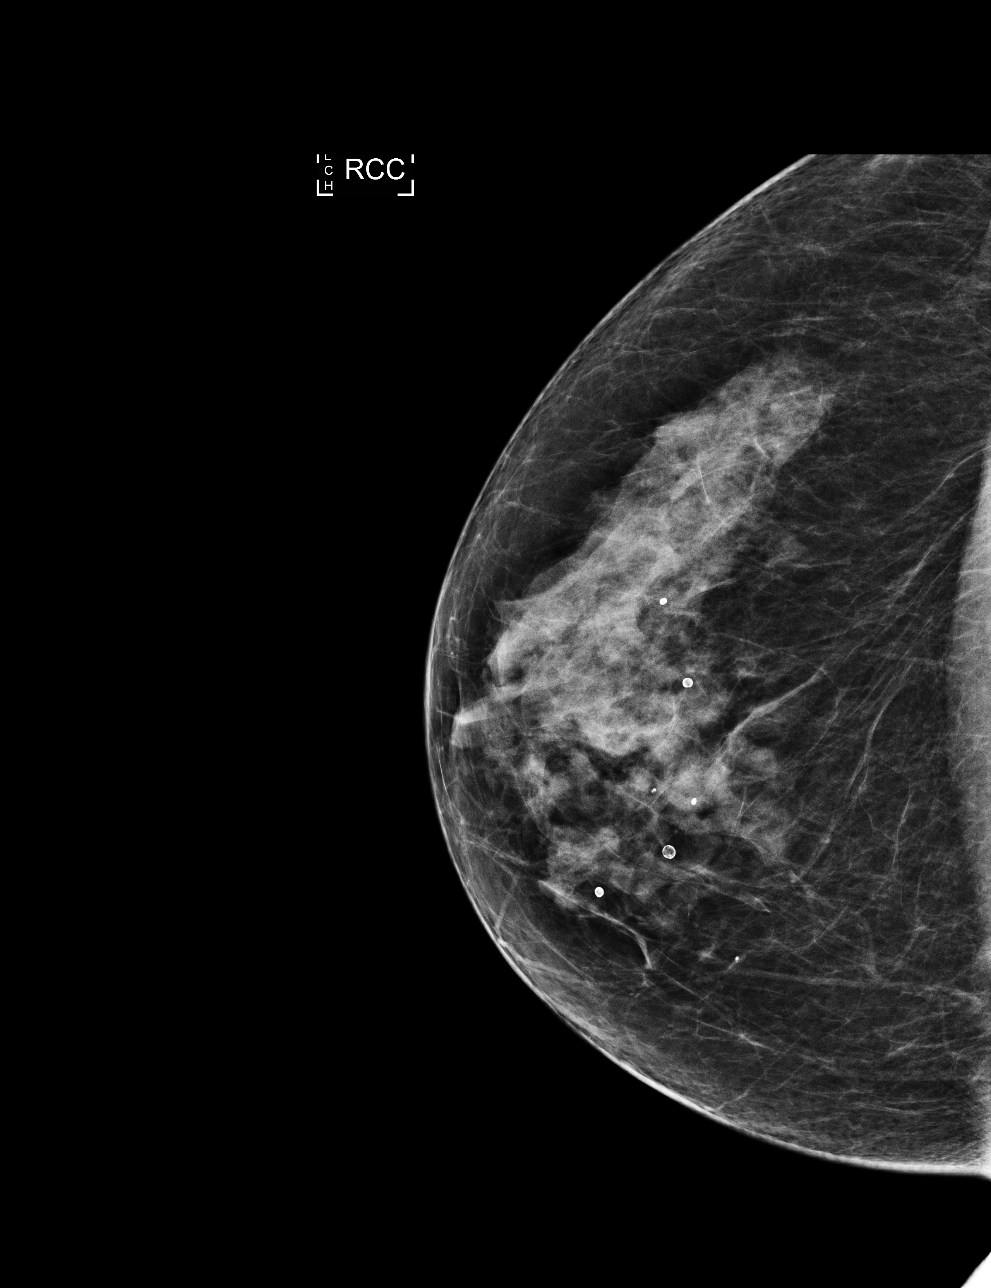

[R MLO]
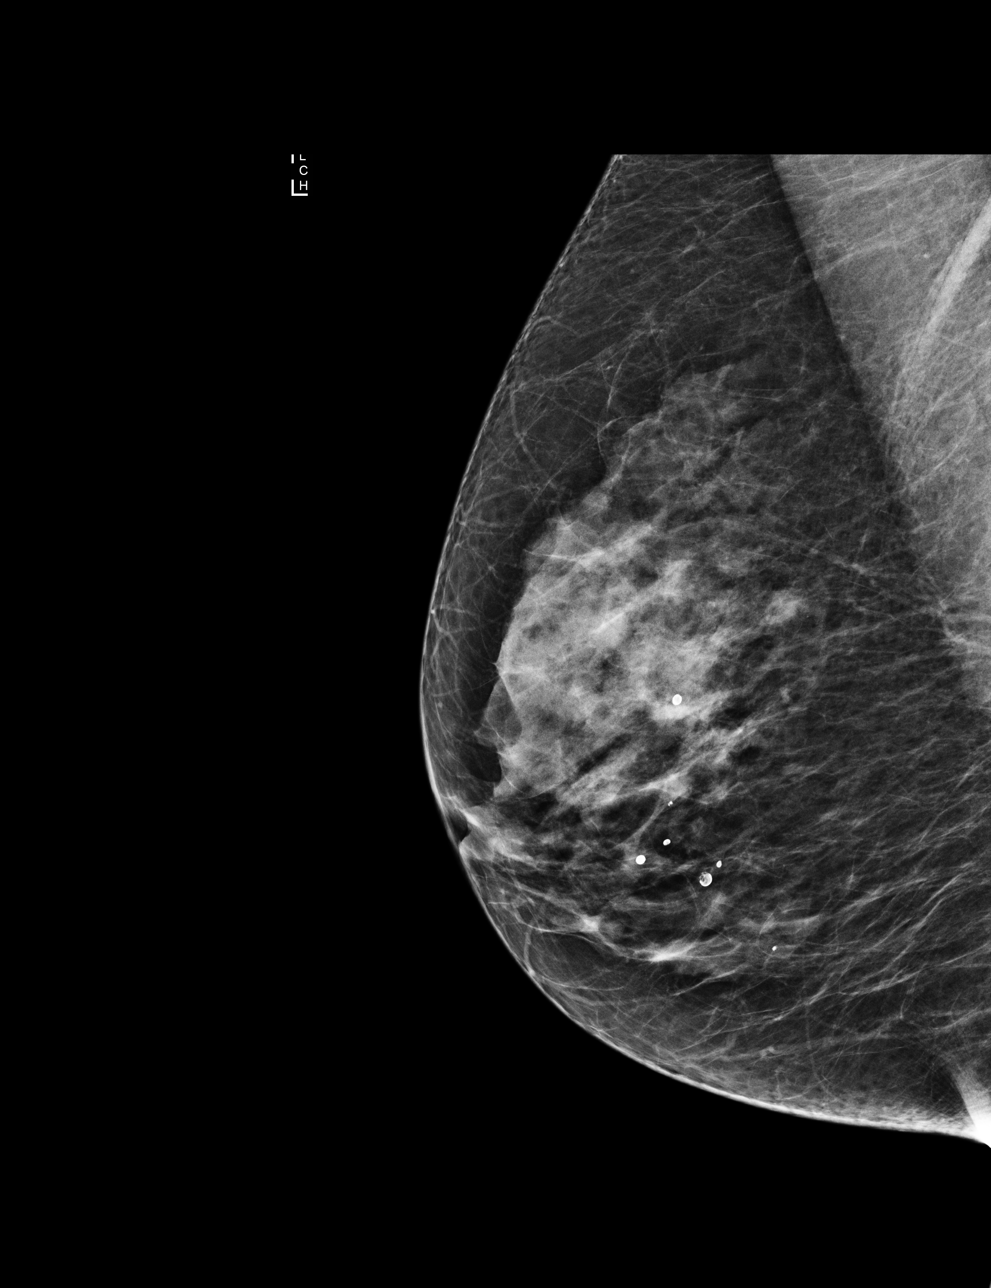

[L CC]
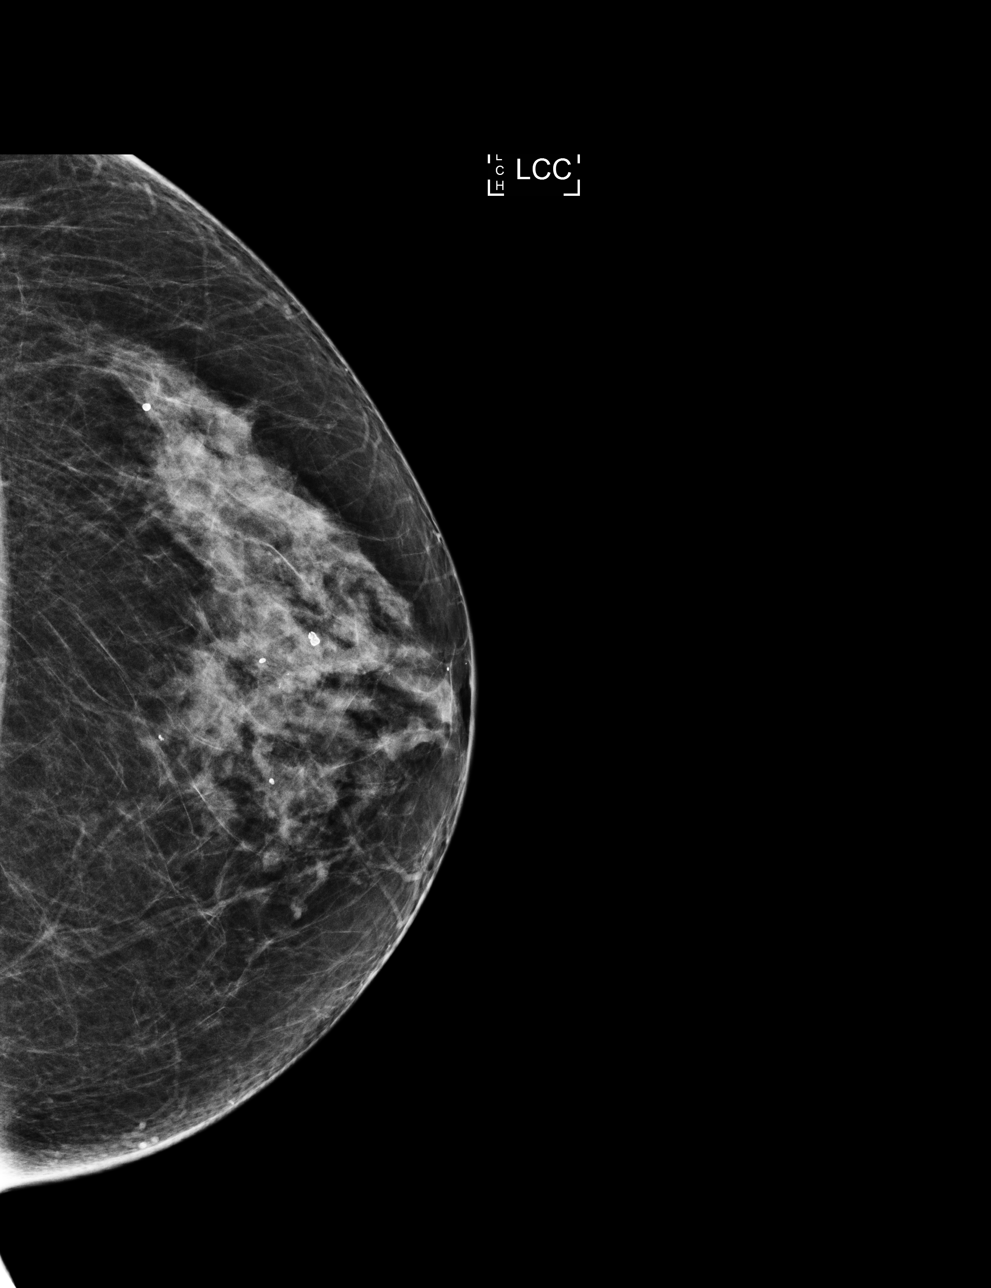

[4 of 4 positions shown; findings below may reference images not displayed]

ACR Breast Density Category c: The breast tissue is heterogeneously
dense, which may obscure small masses.
FINDINGS: There are no findings suspicious for malignancy. Images were
processed with CAD.
IMPRESSION: No mammographic evidence of malignancy. A result letter of this
screening mammogram will be mailed directly to the patient.

RECOMMENDATION:
Screening mammogram in one year. (Code:[id number])

BI-RADS CATEGORY  1: Negative.

## 2018-11-10 ENCOUNTER — Other Ambulatory Visit: Payer: Self-pay | Admitting: Internal Medicine

## 2018-11-10 DIAGNOSIS — Z1231 Encounter for screening mammogram for malignant neoplasm of breast: Secondary | ICD-10-CM

## 2018-12-13 ENCOUNTER — Ambulatory Visit
Admission: RE | Admit: 2018-12-13 | Discharge: 2018-12-13 | Disposition: A | Discharge status: Home / Self Care | Payer: Medicare Other | Source: Ambulatory Visit | Attending: Internal Medicine | Admitting: Internal Medicine

## 2018-12-13 DIAGNOSIS — Z1231 Encounter for screening mammogram for malignant neoplasm of breast: Secondary | ICD-10-CM | POA: Insufficient documentation

## 2019-02-14 ENCOUNTER — Encounter: Payer: Self-pay | Admitting: Podiatry

## 2019-02-14 ENCOUNTER — Other Ambulatory Visit: Payer: Self-pay

## 2019-02-14 ENCOUNTER — Ambulatory Visit (INDEPENDENT_AMBULATORY_CARE_PROVIDER_SITE_OTHER): Payer: Medicare Other | Admitting: Podiatry

## 2019-02-14 DIAGNOSIS — L600 Ingrowing nail: Secondary | ICD-10-CM

## 2019-02-14 MED ORDER — NEOMYCIN-POLYMYXIN-HC 1 % OT SOLN: OTIC | 1 refills | Status: AC

## 2019-02-14 NOTE — Patient Instructions (Signed)

## 2019-02-14 NOTE — Progress Notes (Signed)
Subjective:  Patient ID: Nicole Jacobson, female    DOB: 04-Jun-1946,  MRN: [id number] HPI Chief Complaint  Patient presents with  . Toe Pain    Hallux right - medial border, tender x 1 month, tried trimming and soaking  . New Patient (Initial Visit)    73 y.o. female presents with the above complaint.   ROS: Denies fever chills nausea vomiting muscle aches pains calf pain back pain chest pain shortness of breath.  Past Medical History:  Diagnosis Date  . Allergic state   . Arthritis   . Encounter for long-term (current) use of other high-risk medications   . Fuch endothelial dystrophy   . Fuchs' corneal dystrophy   . History of chickenpox   . Hypothyroidism    unspecified  . Insomnia, unspecified   . Menopausal and postmenopausal disorder    unspecified  . Osteopenia   . Osteopenia    hips  . Quadriceps muscle strain   . Thyroid disease   . Wrist fracture, open    left wrist   Past Surgical History:  Procedure Laterality Date  . CARPAL TUNNEL RELEASE Right    endoscopic  . CERVICAL FUSION     two level  . COLONOSCOPY WITH PROPOFOL N/A 12/06/2015   Procedure: COLONOSCOPY WITH PROPOFOL;  Surgeon: Scot Jun, MD;  Location: [facility] ENDOSCOPY;  Service: Endoscopy;  Laterality: N/A;  . DE QUERVAIN'S RELEASE Left 08/2002  . EYE SURGERY Bilateral    cataract extraction with lens implants  . JOINT REPLACEMENT     bilateral hips; 10/2014 most recent  . TOTAL HIP ARTHROPLASTY Left    10/11/14  . TOTAL HIP ARTHROPLASTY Right    07/02/2004  . TRIGGER FINGER RELEASE    . TUBAL LIGATION      Current Outpatient Medications:  .  Calcium Carb-Cholecalciferol (CALCIUM 1000 + D) 1000-800 MG-UNIT TABS, 1 tab three times a day, Disp: , Rfl:  .  ibandronate (BONIVA) 150 MG tablet, Take by mouth., Disp: , Rfl:  .  aspirin EC 81 MG tablet, Take 81 mg by mouth daily., Disp: , Rfl:  .  BIOTIN PO, Take by mouth., Disp: , Rfl:  .  Calcium-Vitamin D (CALTRATE 600 PLUS-VIT D PO),  Take 1 tablet by mouth 3 (three) times daily., Disp: , Rfl:  .  fluticasone (FLONASE) 50 MCG/ACT nasal spray, Place 1 spray into both nostrils 2 (two) times daily., Disp: , Rfl:  .  levothyroxine (SYNTHROID, LEVOTHROID) 50 MCG tablet, Take 50 mcg by mouth daily before breakfast., Disp: , Rfl:  .  Multiple Vitamins-Minerals (MULTIVITAMIN & MINERAL PO), Take by mouth., Disp: , Rfl:  .  NEOMYCIN-POLYMYXIN-HYDROCORTISONE (CORTISPORIN) 1 % SOLN OTIC solution, Apply 1-2 drops to toe BID after soaking, Disp: 10 mL, Rfl: 1 .  Omega-3 Fatty Acids (FISH OIL PO), Take by mouth., Disp: , Rfl:  .  zolpidem (AMBIEN) 5 MG tablet, Take 10 mg by mouth at bedtime as needed for sleep., Disp: , Rfl:   Allergies  Allergen Reactions  . Codeine Nausea Only  . Morphine And Related   . Percocet [Oxycodone-Acetaminophen] Nausea Only  . Sulfa Antibiotics   . Sulfasalazine   . Parafon Forte Dsc [Chlorzoxazone] Rash   Review of Systems Objective:  There were no vitals filed for this visit.  General: Well developed, nourished, in no acute distress, alert and oriented x3   Dermatological: Skin is warm, dry and supple bilateral. Nails x 10 are well maintained; remaining integument appears unremarkable at  this time. There are no open sores, no preulcerative lesions, no rash or signs of infection present.  Sharp incurvated nail margin tibial border hallux right mild tenderness on palpation mild erythema no cellulitis drainage or odor.  Vascular: Dorsalis Pedis artery and Posterior Tibial artery pedal pulses are 2/4 bilateral with immedate capillary fill time. Pedal hair growth present. No varicosities and no lower extremity edema present bilateral.   Neruologic: Grossly intact via light touch bilateral. Vibratory intact via tuning fork bilateral. Protective threshold with Semmes Wienstein monofilament intact to all pedal sites bilateral. Patellar and Achilles deep tendon reflexes 2+ bilateral. No Babinski or clonus noted  bilateral.   Musculoskeletal: No gross boney pedal deformities bilateral. No pain, crepitus, or limitation noted with foot and ankle range of motion bilateral. Muscular strength 5/5 in all groups tested bilateral.  Gait: Unassisted, Nonantalgic.    Radiographs:  None taken  Assessment & Plan:   Assessment: Ingrown toenail tibial border hallux right  Plan: Chemical matrixectomy was performed today tibial border hallux right after local anesthesia was administered.  Tolerated the procedure well.  Was provided with both oral and written home-going stressed for the care and soaking of the toe as well as a prescription for Cortisporin Otic.  Follow-up with her in 2 weeks.     Romey Cohea T. [place], [place]

## 2019-02-28 ENCOUNTER — Ambulatory Visit: Payer: Medicare Other | Admitting: Podiatry

## 2019-11-29 ENCOUNTER — Other Ambulatory Visit: Payer: Self-pay | Admitting: Internal Medicine

## 2019-12-23 ENCOUNTER — Other Ambulatory Visit: Payer: Self-pay | Admitting: Internal Medicine

## 2019-12-23 DIAGNOSIS — Z1231 Encounter for screening mammogram for malignant neoplasm of breast: Secondary | ICD-10-CM

## 2020-03-19 ENCOUNTER — Ambulatory Visit
Admission: RE | Admit: 2020-03-19 | Discharge: 2020-03-19 | Disposition: A | Discharge status: Home / Self Care | Payer: Medicare PPO | Source: Ambulatory Visit | Attending: Internal Medicine | Admitting: Internal Medicine

## 2020-03-19 DIAGNOSIS — Z1231 Encounter for screening mammogram for malignant neoplasm of breast: Secondary | ICD-10-CM | POA: Insufficient documentation

## 2021-06-11 ENCOUNTER — Other Ambulatory Visit: Payer: Self-pay | Admitting: Internal Medicine

## 2021-06-11 DIAGNOSIS — E2839 Other primary ovarian failure: Secondary | ICD-10-CM

## 2021-06-11 DIAGNOSIS — M8580 Other specified disorders of bone density and structure, unspecified site: Secondary | ICD-10-CM

## 2021-06-11 DIAGNOSIS — Z1231 Encounter for screening mammogram for malignant neoplasm of breast: Secondary | ICD-10-CM

## 2021-07-02 ENCOUNTER — Ambulatory Visit
Admission: RE | Admit: 2021-07-02 | Discharge: 2021-07-02 | Disposition: A | Discharge status: Home / Self Care | Payer: Medicare HMO | Source: Ambulatory Visit | Attending: Internal Medicine | Admitting: Internal Medicine

## 2021-07-02 ENCOUNTER — Other Ambulatory Visit: Payer: Self-pay | Admitting: Internal Medicine

## 2021-07-02 ENCOUNTER — Other Ambulatory Visit: Payer: Self-pay

## 2021-07-02 DIAGNOSIS — Z1231 Encounter for screening mammogram for malignant neoplasm of breast: Secondary | ICD-10-CM | POA: Diagnosis not present

## 2021-07-02 DIAGNOSIS — M8580 Other specified disorders of bone density and structure, unspecified site: Secondary | ICD-10-CM | POA: Diagnosis present

## 2021-07-02 DIAGNOSIS — E2839 Other primary ovarian failure: Secondary | ICD-10-CM | POA: Diagnosis present

## 2022-05-29 ENCOUNTER — Other Ambulatory Visit: Payer: Self-pay | Admitting: Internal Medicine

## 2022-05-29 DIAGNOSIS — Z1231 Encounter for screening mammogram for malignant neoplasm of breast: Secondary | ICD-10-CM

## 2022-07-03 ENCOUNTER — Ambulatory Visit
Admission: RE | Admit: 2022-07-03 | Discharge: 2022-07-03 | Disposition: A | Discharge status: Home / Self Care | Payer: Medicare HMO | Source: Ambulatory Visit | Attending: Internal Medicine | Admitting: Internal Medicine

## 2022-07-03 DIAGNOSIS — Z1231 Encounter for screening mammogram for malignant neoplasm of breast: Secondary | ICD-10-CM

## 2024-02-12 ENCOUNTER — Ambulatory Visit (INDEPENDENT_AMBULATORY_CARE_PROVIDER_SITE_OTHER)

## 2024-02-12 DIAGNOSIS — K5730 Diverticulosis of large intestine without perforation or abscess without bleeding: Secondary | ICD-10-CM | POA: Diagnosis not present

## 2024-02-12 DIAGNOSIS — Z860101 Personal history of adenomatous and serrated colon polyps: Secondary | ICD-10-CM | POA: Diagnosis not present

## 2024-02-12 DIAGNOSIS — K640 First degree hemorrhoids: Secondary | ICD-10-CM | POA: Diagnosis not present

## 2024-02-12 DIAGNOSIS — Z09 Encounter for follow-up examination after completed treatment for conditions other than malignant neoplasm: Secondary | ICD-10-CM | POA: Diagnosis present

## 2024-08-12 ENCOUNTER — Other Ambulatory Visit: Payer: Self-pay

## 2024-08-12 ENCOUNTER — Emergency Department
Admission: EM | Admit: 2024-08-12 | Discharge: 2024-08-12 | Disposition: A | Discharge status: Home / Self Care | Attending: Emergency Medicine | Admitting: Emergency Medicine

## 2024-08-12 ENCOUNTER — Emergency Department

## 2024-08-12 DIAGNOSIS — Z041 Encounter for examination and observation following transport accident: Secondary | ICD-10-CM | POA: Diagnosis present

## 2024-08-12 DIAGNOSIS — Y92410 Unspecified street and highway as the place of occurrence of the external cause: Secondary | ICD-10-CM | POA: Insufficient documentation
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
## 2024-08-12 NOTE — ED Notes (Signed)
 PT in no acute distress prior to discharge. Discharged instructions reviewed and pt stated that they understand directions. Pt has all belongings with them at time of discharge.
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
